# Patient Record
Sex: Male | Born: 1980 | Hispanic: No | Marital: Married | Smoking: Never smoker
Health system: Southern US, Community
[De-identification: ages and names within clinical notes are randomized; demographics above are authoritative.]

## PROBLEM LIST (undated history)

## (undated) DIAGNOSIS — E119 Type 2 diabetes mellitus without complications: Secondary | ICD-10-CM

## (undated) DIAGNOSIS — I10 Essential (primary) hypertension: Secondary | ICD-10-CM

---

## 2014-02-18 ENCOUNTER — Emergency Department (HOSPITAL_COMMUNITY)
Admission: EM | Admit: 2014-02-18 | Discharge: 2014-02-18 | Disposition: A | Discharge status: Home / Self Care | Payer: Self-pay | Attending: Emergency Medicine | Admitting: Emergency Medicine

## 2014-02-18 ENCOUNTER — Emergency Department (HOSPITAL_COMMUNITY): Payer: Self-pay

## 2014-02-18 DIAGNOSIS — S9030XA Contusion of unspecified foot, initial encounter: Secondary | ICD-10-CM | POA: Insufficient documentation

## 2014-02-18 DIAGNOSIS — Y929 Unspecified place or not applicable: Secondary | ICD-10-CM | POA: Insufficient documentation

## 2014-02-18 DIAGNOSIS — Z23 Encounter for immunization: Secondary | ICD-10-CM | POA: Insufficient documentation

## 2014-02-18 DIAGNOSIS — Y9389 Activity, other specified: Secondary | ICD-10-CM | POA: Insufficient documentation

## 2014-02-18 DIAGNOSIS — S9031XA Contusion of right foot, initial encounter: Secondary | ICD-10-CM

## 2014-02-18 DIAGNOSIS — W208XXA Other cause of strike by thrown, projected or falling object, initial encounter: Secondary | ICD-10-CM | POA: Insufficient documentation

## 2014-02-18 MED ORDER — HYDROCODONE-ACETAMINOPHEN 5-325 MG PO TABS
1.0000 | ORAL_TABLET | Freq: Once | ORAL | Status: AC
  Administered 2014-02-18: 1 via ORAL
  Filled 2014-02-18: qty 1

## 2014-02-18 MED ORDER — IBUPROFEN 800 MG PO TABS: 800.0000 mg | ORAL_TABLET | Freq: Three times a day (TID) | ORAL | Status: AC

## 2014-02-18 MED ORDER — HYDROCODONE-ACETAMINOPHEN 5-325 MG PO TABS: 1.0000 | ORAL_TABLET | ORAL | Status: AC | PRN

## 2014-02-18 MED ORDER — TETANUS-DIPHTH-ACELL PERTUSSIS 5-2.5-18.5 LF-MCG/0.5 IM SUSP
0.5000 mL | Freq: Once | INTRAMUSCULAR | Status: AC
Start: 2014-02-18 — End: 2014-02-18
  Administered 2014-02-18: 0.5 mL via INTRAMUSCULAR
  Filled 2014-02-18: qty 0.5

## 2014-02-18 NOTE — Discharge Instructions (Signed)
Contusion °A contusion is a deep bruise. Contusions are the result of an injury that caused bleeding under the skin. The contusion may turn blue, purple, or yellow. Minor injuries will give you a painless contusion, but more severe contusions may stay painful and swollen for a few weeks.  °CAUSES  °A contusion is usually caused by a blow, trauma, or direct force to an area of the body. °SYMPTOMS  °· Swelling and redness of the injured area. °· Bruising of the injured area. °· Tenderness and soreness of the injured area. °· Pain. °DIAGNOSIS  °The diagnosis can be made by taking a history and physical exam. An X-ray, CT scan, or MRI may be needed to determine if there were any associated injuries, such as fractures. °TREATMENT  °Specific treatment will depend on what area of the body was injured. In general, the best treatment for a contusion is resting, icing, elevating, and applying cold compresses to the injured area. Over-the-counter medicines may also be recommended for pain control. Ask your caregiver what the best treatment is for your contusion. °HOME CARE INSTRUCTIONS  °· Put ice on the injured area. °· Put ice in a plastic bag. °· Place a towel between your skin and the bag. °· Leave the ice on for 15-20 minutes, 03-04 times a day. °· Only take over-the-counter or prescription medicines for pain, discomfort, or fever as directed by your caregiver. Your caregiver may recommend avoiding anti-inflammatory medicines (aspirin, ibuprofen, and naproxen) for 48 hours because these medicines may increase bruising. °· Rest the injured area. °· If possible, elevate the injured area to reduce swelling. °SEEK IMMEDIATE MEDICAL CARE IF:  °· You have increased bruising or swelling. °· You have pain that is getting worse. °· Your swelling or pain is not relieved with medicines. °MAKE SURE YOU:  °· Understand these instructions. °· Will watch your condition. °· Will get help right away if you are not doing well or get  worse. °Document Released: 09/13/2005 Document Revised: 02/26/2012 Document Reviewed: 10/09/2011 °ExitCare® Patient Information ©2014 [employer]. ° °Cryotherapy °Cryotherapy means treatment with cold. Ice or gel packs can be used to reduce both pain and swelling. Ice is the most helpful within the first 24 to 48 hours after an injury or flareup from overusing a muscle or joint. Sprains, strains, spasms, burning pain, shooting pain, and aches can all be eased with ice. Ice can also be used when recovering from surgery. Ice is effective, has very few side effects, and is safe for most people to use. °PRECAUTIONS  °Ice is not a safe treatment option for people with: °· Raynaud's phenomenon. This is a condition affecting small blood vessels in the extremities. Exposure to cold may cause your problems to return. °· Cold hypersensitivity. There are many forms of cold hypersensitivity, including: °· Cold urticaria. Red, itchy hives appear on the skin when the tissues begin to warm after being iced. °· Cold erythema. This is a red, itchy rash caused by exposure to cold. °· Cold hemoglobinuria. Red blood cells break down when the tissues begin to warm after being iced. The hemoglobin that carry oxygen are passed into the urine because they cannot combine with blood proteins fast enough. °· Numbness or altered sensitivity in the area being iced. °If you have any of the following conditions, do not use ice until you have discussed cryotherapy with your caregiver: °· Heart conditions, such as arrhythmia, angina, or chronic heart disease. °· High blood pressure. °· Healing wounds or open skin   in the area being iced. °· Current infections. °· Rheumatoid arthritis. °· Poor circulation. °· Diabetes. °Ice slows the blood flow in the region it is applied. This is beneficial when trying to stop inflamed tissues from spreading irritating chemicals to surrounding tissues. However, if you expose your skin to cold temperatures for too  long or without the proper protection, you can damage your skin or nerves. Watch for signs of skin damage due to cold. °HOME CARE INSTRUCTIONS °Follow these tips to use ice and cold packs safely. °· Place a dry or damp towel between the ice and skin. A damp towel will cool the skin more quickly, so you may need to shorten the time that the ice is used. °· For a more rapid response, add gentle compression to the ice. °· Ice for no more than 10 to 20 minutes at a time. The bonier the area you are icing, the less time it will take to get the benefits of ice. °· Check your skin after 5 minutes to make sure there are no signs of a poor response to cold or skin damage. °· Rest 20 minutes or more in between uses. °· Once your skin is numb, you can end your treatment. You can test numbness by very lightly touching your skin. The touch should be so light that you do not see the skin dimple from the pressure of your fingertip. When using ice, most people will feel these normal sensations in this order: cold, burning, aching, and numbness. °· Do not use ice on someone who cannot communicate their responses to pain, such as small children or people with dementia. °HOW TO MAKE AN ICE PACK °Ice packs are the most common way to use ice therapy. Other methods include ice massage, ice baths, and cryo-sprays. Muscle creams that cause a cold, tingly feeling do not offer the same benefits that ice offers and should not be used as a substitute unless recommended by your caregiver. °To make an ice pack, do one of the following: °· Place crushed ice or a bag of frozen vegetables in a sealable plastic bag. Squeeze out the excess air. Place this bag inside another plastic bag. Slide the bag into a pillowcase or place a damp towel between your skin and the bag. °· Mix 3 parts water with 1 part rubbing alcohol. Freeze the mixture in a sealable plastic bag. When you remove the mixture from the freezer, it will be slushy. Squeeze out the excess  air. Place this bag inside another plastic bag. Slide the bag into a pillowcase or place a damp towel between your skin and the bag. °SEEK MEDICAL CARE IF: °· You develop white spots on your skin. This may give the skin a blotchy (mottled) appearance. °· Your skin turns blue or pale. °· Your skin becomes waxy or hard. °· Your swelling gets worse. °MAKE SURE YOU:  °· Understand these instructions. °· Will watch your condition. °· Will get help right away if you are not doing well or get worse. °Document Released: 07/31/2011 Document Revised: 02/26/2012 Document Reviewed: 07/31/2011 °ExitCare® Patient Information ©2014 [employer]. ° °

## 2014-02-18 NOTE — ED Notes (Signed)
Pt has a ride home.  

## 2014-02-18 NOTE — ED Notes (Signed)
Pt states a he was carrying a door and it hit him on the top of his R foot. Pt has swelling and bruising to top of R foot. Pt states he has been unable to bear wt on R leg.

## 2014-02-18 NOTE — ED Notes (Signed)
Ortho tech at bedside for post op shoe and crutches.

## 2014-02-18 NOTE — ED Provider Notes (Signed)
CSN: [id number]     Arrival date & time 02/18/14  1935 History  This chart was scribed for non-physician practitioner, Elpidio Anis, PA-C,working with Flint Melter, MD, by Karle Plumber, ED Scribe.  This patient was seen in room [facility] and the patient's care was started at 8:36 PM.  Chief Complaint  Patient presents with  . Foot Injury   The history is provided by the patient. No language interpreter was used.   HPI Comments:  Shawn Camacho is a 33 y.o. male who presents to the Emergency Department complaining of a right foot injury secondary to dropping a door on it approximately 2.5 hours ago. He reports associated swelling and a small abrasion to the dorsum of his foot. Pt states he is unaware of his last tetanus vaccination. He denies any chronic medical problems. He denies any other injuries.   No past medical history on file. No past surgical history on file. No family history on file. History  Substance Use Topics  . Smoking status: Not on file  . Smokeless tobacco: Not on file  . Alcohol Use: Not on file    Review of Systems  Musculoskeletal: Positive for arthralgias.  Skin: Positive for color change (bruise and abrasion to right dorsal foot).  All other systems reviewed and are negative.    Allergies  Review of patient's allergies indicates no known allergies.  Home Medications  No current outpatient prescriptions on file. Triage Vitals: BP 118/65  Pulse 77  Temp(Src) 98 F (36.7 C) (Oral)  Resp 18  SpO2 100% Physical Exam  Nursing note and vitals reviewed. Constitutional: He is oriented to person, place, and time. He appears well-developed and well-nourished.  HENT:  Head: Normocephalic and atraumatic.  Eyes: EOM are normal.  Neck: Normal range of motion.  Pulmonary/Chest: Effort normal. No respiratory distress.  Musculoskeletal: Normal range of motion. He exhibits edema and tenderness.  Right foot markedly swollen with superficial  abrasion to central dorsal forefoot. Full ROM of all digits. Ankle nontender. No obvious bony deformity.   Neurological: He is alert and oriented to person, place, and time.  Skin: Skin is warm and dry.  Psychiatric: He has a normal mood and affect. His behavior is normal.    ED Course  Procedures (including critical care time) DIAGNOSTIC STUDIES: Oxygen Saturation is 100% on RA, normal by my interpretation.   COORDINATION OF CARE: 8:37 PM- Will X-Ray right foot. Offered pain medication but pt declined. Will update tetanus vaccination. Pt verbalizes understanding and agrees to plan.  Medications  Tdap (BOOSTRIX) injection 0.5 mL (not administered)    Labs Review Labs Reviewed - No data to display Imaging Review Dg Foot Complete Right  02/18/2014   CLINICAL DATA:  Right foot pain and swelling.  EXAM: RIGHT FOOT COMPLETE - 3+ VIEW  COMPARISON:  None.  FINDINGS: Metatarsals are intact. There is no fracture or acute osseous abnormality. The hematomas prepped in the dorsal soft tissues of the forefoot overlying the metatarsals.  IMPRESSION: Dorsal forefoot soft cheek, without osseous injury.   Electronically Signed   By: Andreas Newport M.D.   On: 02/18/2014 21:19     EKG Interpretation None      MDM   Final diagnoses:  None    1. Foot contusion, right  Negative x-ray for fracture. Foot significantly swollen and discolored but with full ROM all digits. Doubt ligamentous to tendon compromise. Ice, elevate, pain control.   I personally performed the services described in this documentation,  which was scribed in my presence. The recorded information has been reviewed and is accurate.    Arnoldo HookerShari A Darivs Lunden, PA-C 02/18/14 2147

## 2014-02-18 NOTE — ED Notes (Signed)
Patient transported to X-ray 

## 2014-02-19 NOTE — ED Provider Notes (Signed)
Medical screening examination/treatment/procedure(s) were performed by non-physician practitioner and as supervising physician I was immediately available for consultation/collaboration.  Hailie Searight L Cherye Gaertner, MD 02/19/14 0000 

## 2015-04-09 IMAGING — CR DG FOOT COMPLETE 3+V*R*
3 series · 3 of 3 positions shown · non-contrast
Comparison: None.

CLINICAL DATA: Right foot pain and swelling.

EXAM:
RIGHT FOOT COMPLETE - 3+ VIEW

[x foot ap right]
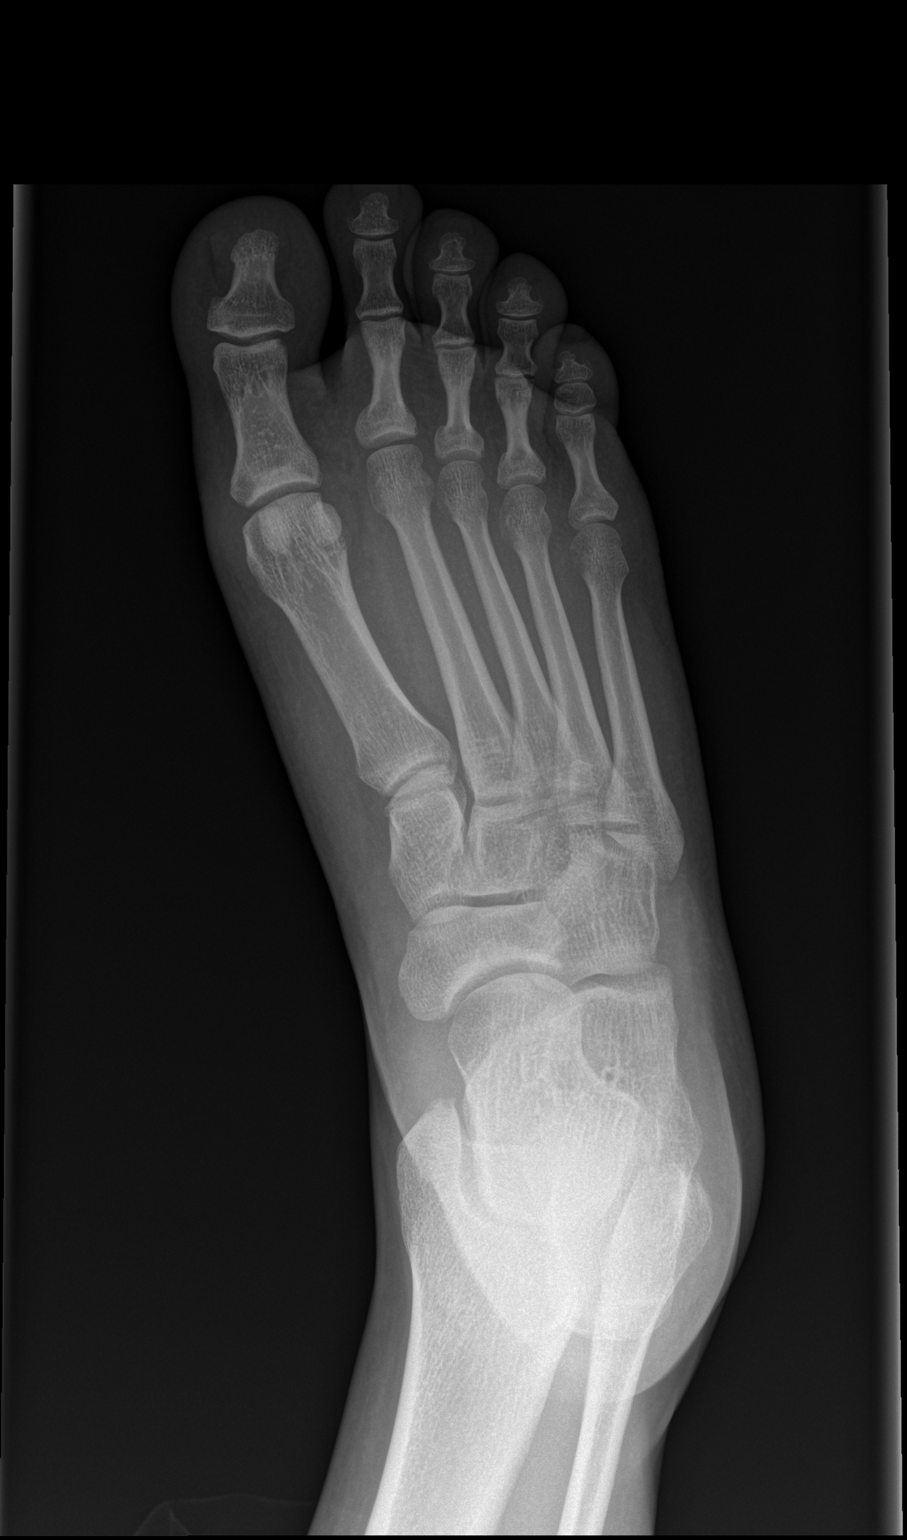

[x foot obl right]
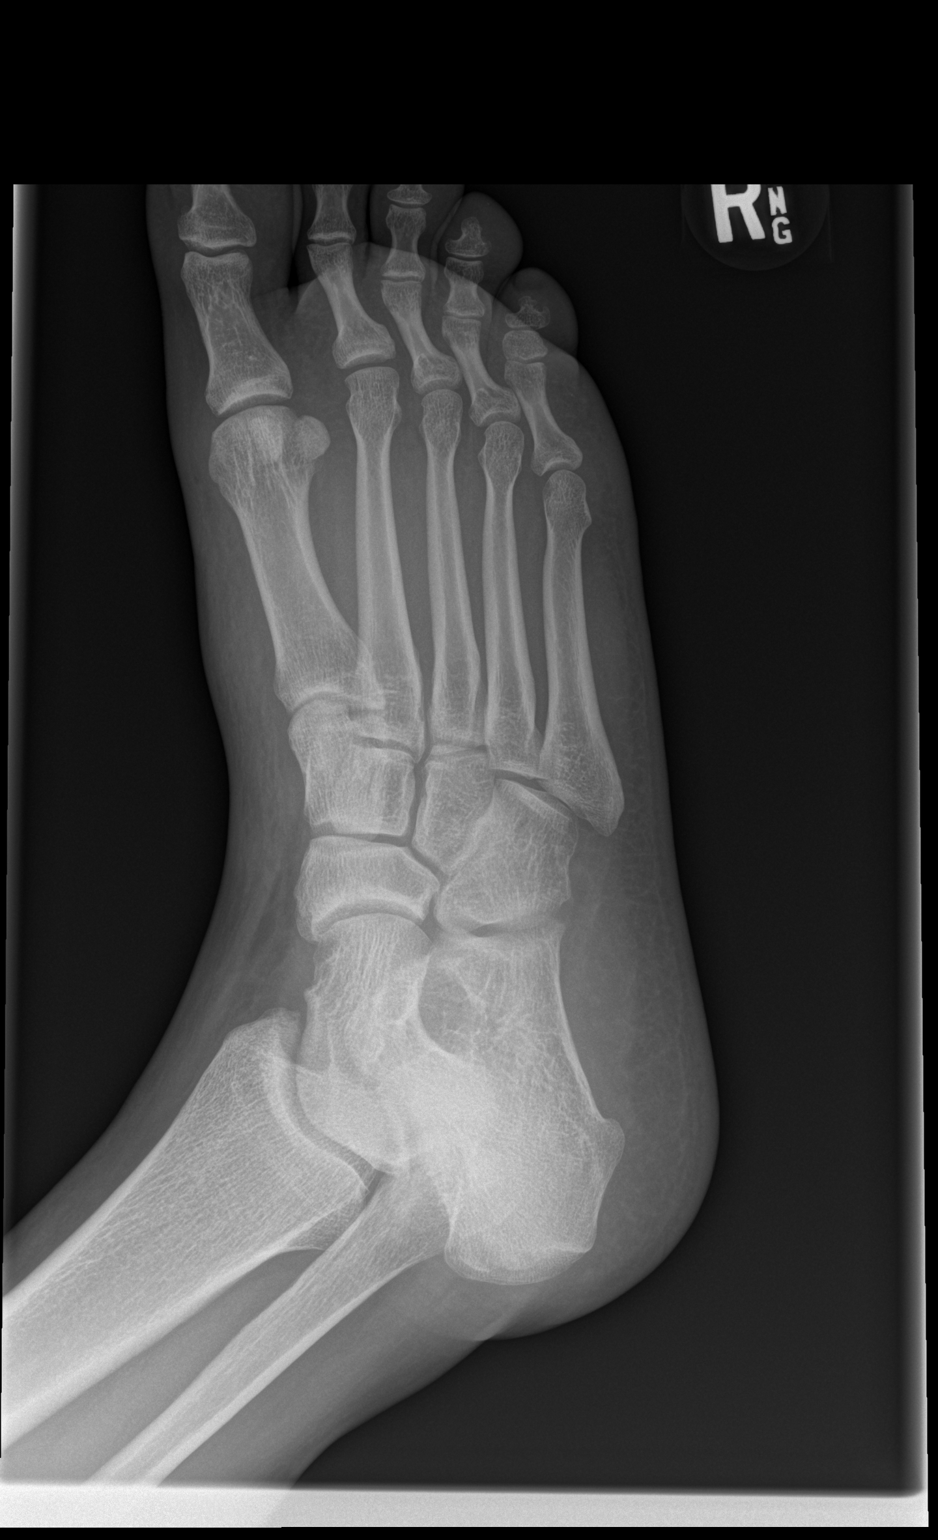

[x foot lat right]
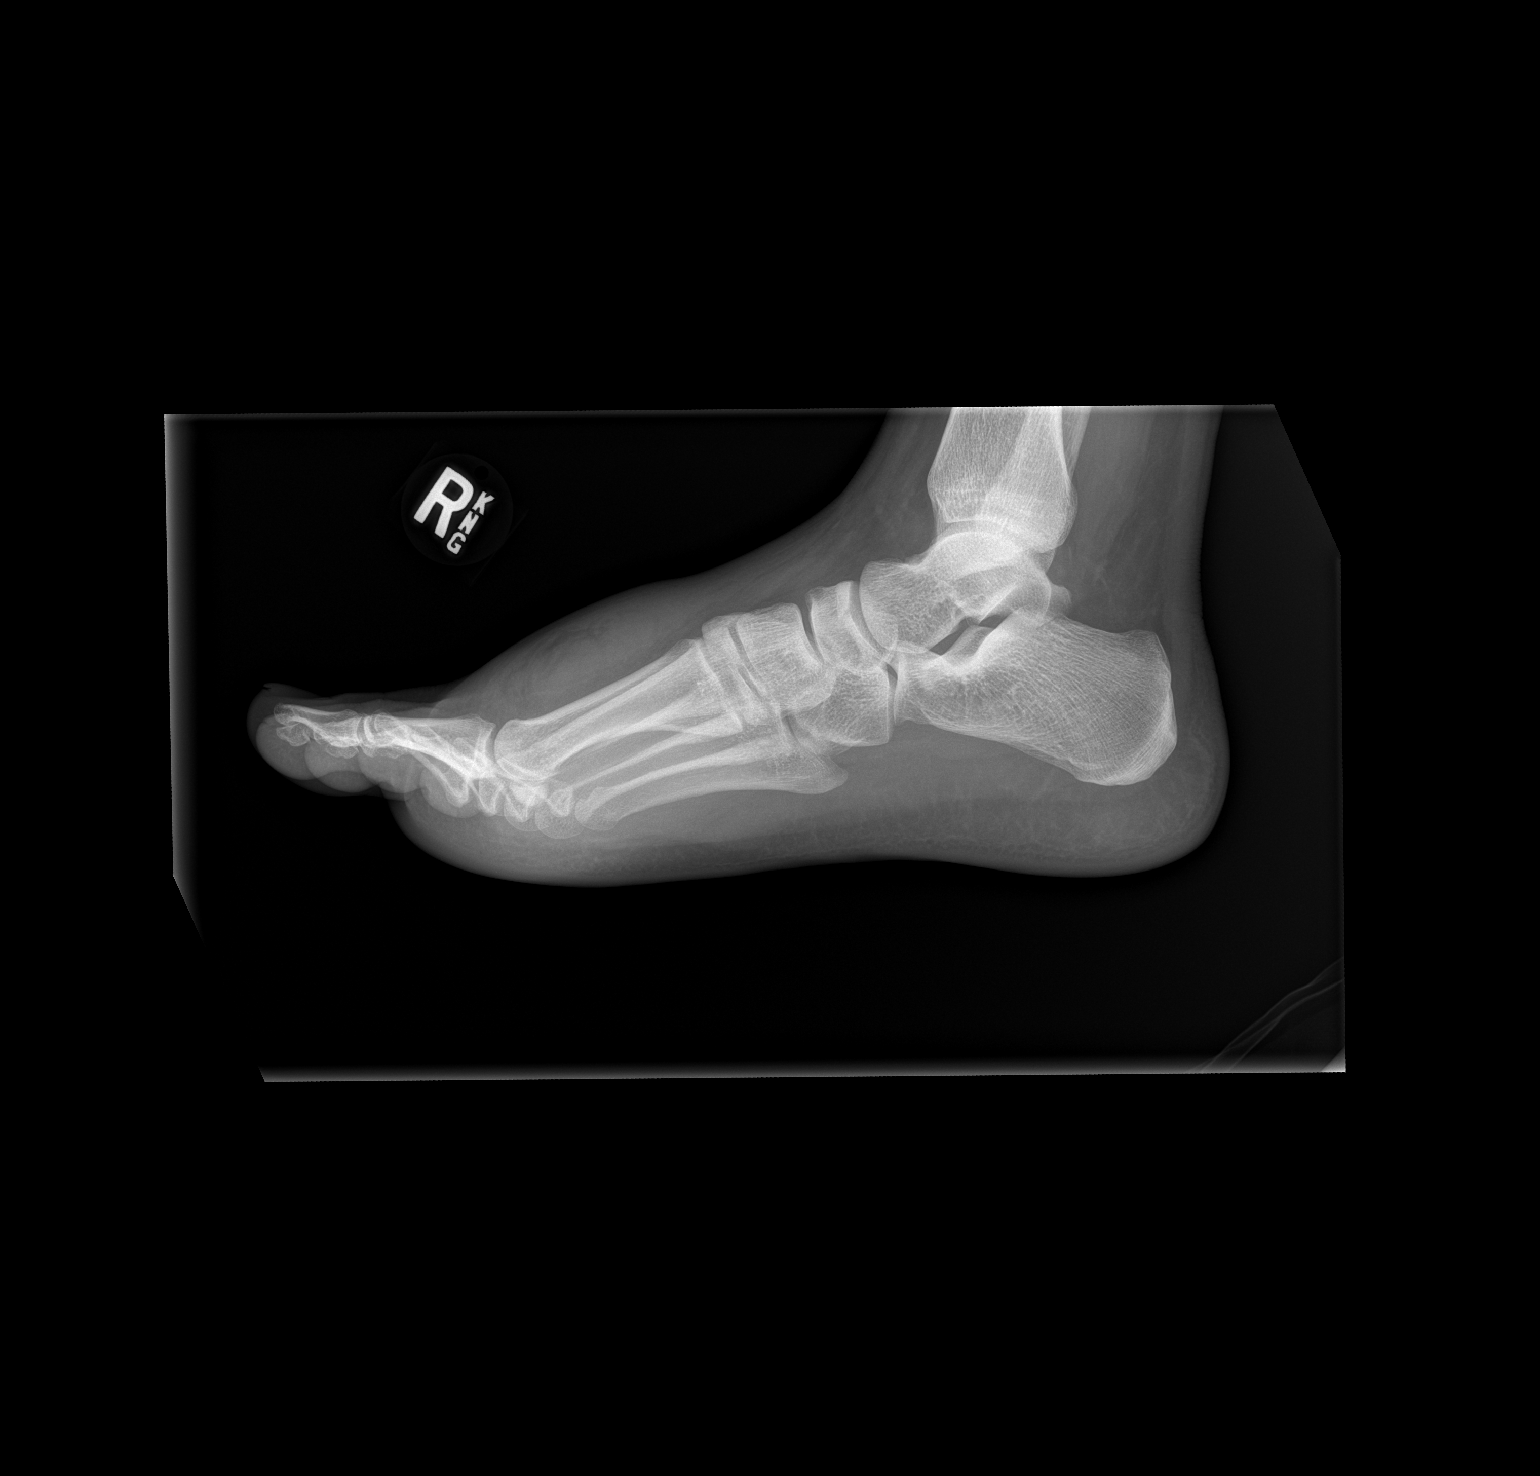

[3 of 3 positions shown; findings below may reference images not displayed]

FINDINGS: Metatarsals are intact. There is no fracture or acute osseous
abnormality. The hematomas prepped in the dorsal soft tissues of the
forefoot overlying the metatarsals.
IMPRESSION: Dorsal forefoot soft cheek, without osseous injury.

## 2019-10-06 ENCOUNTER — Other Ambulatory Visit: Payer: Self-pay

## 2019-10-06 ENCOUNTER — Emergency Department (HOSPITAL_BASED_OUTPATIENT_CLINIC_OR_DEPARTMENT_OTHER)
Admission: EM | Admit: 2019-10-06 | Discharge: 2019-10-06 | Disposition: A | Discharge status: Home / Self Care | Payer: BLUE CROSS/BLUE SHIELD | Attending: Emergency Medicine | Admitting: Emergency Medicine

## 2019-10-06 ENCOUNTER — Emergency Department (HOSPITAL_BASED_OUTPATIENT_CLINIC_OR_DEPARTMENT_OTHER): Payer: BLUE CROSS/BLUE SHIELD

## 2019-10-06 ENCOUNTER — Encounter (HOSPITAL_BASED_OUTPATIENT_CLINIC_OR_DEPARTMENT_OTHER): Payer: Self-pay | Admitting: *Deleted

## 2019-10-06 DIAGNOSIS — E119 Type 2 diabetes mellitus without complications: Secondary | ICD-10-CM | POA: Diagnosis not present

## 2019-10-06 DIAGNOSIS — I10 Essential (primary) hypertension: Secondary | ICD-10-CM | POA: Diagnosis not present

## 2019-10-06 DIAGNOSIS — Z7984 Long term (current) use of oral hypoglycemic drugs: Secondary | ICD-10-CM | POA: Diagnosis not present

## 2019-10-06 DIAGNOSIS — M25572 Pain in left ankle and joints of left foot: Secondary | ICD-10-CM | POA: Diagnosis not present

## 2019-10-06 DIAGNOSIS — X501XXS Overexertion from prolonged static or awkward postures, sequela: Secondary | ICD-10-CM | POA: Diagnosis not present

## 2019-10-06 HISTORY — DX: Essential (primary) hypertension: I10

## 2019-10-06 HISTORY — DX: Type 2 diabetes mellitus without complications: E11.9

## 2019-10-06 NOTE — ED Provider Notes (Signed)
[facility] Provider Note   CSN: [id number] Arrival date & time: 10/06/19  1408     History   Chief Complaint Chief Complaint  Patient presents with  . Ankle Injury    HPI Shawn Camacho is a 38 y.o. male with a past medical history of diabetes, hypertension, who presents today for evaluation of pain in his left ankle.  He reports that 2 days ago he was walking and stepped down rolling his left ankle.  He reports that since then it has been swollen.  It primarily hurts when he puts weight on it.  He denies any pain in his foot or left knee.  No other injuries from this.  He states that his wife has been using turmeric paste on the area causing his skin to turn yellow in that area however he denies any other skin yellowing.  Patient was offered a professional [place] interpreter, however declined stating he does not need an interpreter.     HPI  Past Medical History:  Diagnosis Date  . Diabetes mellitus without complication ([place])   . Hypertension     There are no active problems to display for this patient.   History reviewed. No pertinent surgical history.      Home Medications    Prior to Admission medications   Medication Sig Start Date End Date Taking? Authorizing Provider  GLIMEPIRIDE PO Take by mouth.   Yes [provider]  LISINOPRIL PO Take by mouth.   Yes [provider]  metFORMIN (GLUCOPHAGE) 1000 MG tablet Take 1,000 mg by mouth 2 (two) times daily with a meal.   Yes [provider]  HYDROcodone-acetaminophen (NORCO/VICODIN) 5-325 MG per tablet Take 1-2 tablets by mouth every 4 (four) hours as needed. 02/18/14   Charlann Lange, PA-C  ibuprofen (ADVIL,MOTRIN) 800 MG tablet Take 1 tablet (800 mg total) by mouth 3 (three) times daily. 02/18/14   Charlann Lange, PA-C    Family History No family history on file.  Social History Social History   Tobacco Use  . Smoking status: Never Smoker   . Smokeless tobacco: Never Used  Substance Use Topics  . Alcohol use: Never    Frequency: Never  . Drug use: Never     Allergies   Patient has no known allergies.   Review of Systems Review of Systems  Constitutional: Negative for chills and fever.  Musculoskeletal:       Left ankle pain, swelling  Neurological: Negative for weakness and headaches.  All other systems reviewed and are negative.    Physical Exam Updated Vital Signs BP 104/73   Pulse 77   Temp 98.9 F (37.2 C) (Oral)   Resp 14   Ht 6\' 1"  (1.854 m)   Wt 83.5 kg   SpO2 100%   BMI 24.28 kg/m   Physical Exam Vitals signs and nursing note reviewed.  Constitutional:      General: He is not in acute distress.    Appearance: He is not ill-appearing.  HENT:     Head: Normocephalic.  Eyes:     Conjunctiva/sclera: Conjunctivae normal.     Comments: No visualized scleral icterus.  Cardiovascular:     Rate and Rhythm: Normal rate.  Pulmonary:     Effort: Pulmonary effort is normal. No respiratory distress.  Musculoskeletal:     Comments: Left lower extremity: He is able to move the toes on his left foot.  He has slightly limited ankle plantarflexion and dorsiflexion  secondary to pain and swelling.  There is no abnormal erythema, or significant pain with motion during exam.  There is mild edema over the bilateral malleoli, lateral worse than medial.  There is no tenderness to palpation over the left foot, left proximal lower leg or knee.  Skin:    Comments: There is slight yellowing over the swollen areas over the left bilateral malleoli consistent with reported to [place] use.  No other visualized skin yellowing.  Neurological:     Mental Status: He is alert.     Comments: Sensation intact to left foot to light touch.      ED Treatments / Results  Labs (all labs ordered are listed, but only abnormal results are displayed) Labs Reviewed - No data to display  EKG None  Radiology Dg Ankle Complete  Left  Result Date: 10/06/2019 CLINICAL DATA:  Twisted left ankle. EXAM: LEFT ANKLE COMPLETE - 3+ VIEW COMPARISON:  No prior. FINDINGS: Diffuse soft tissue swelling. No evidence of fracture or dislocation. IMPRESSION: No acute bony abnormality identified. Electronically Signed   By: Maisie Fus  Register   On: 10/06/2019 14:53    Procedures Procedures (including critical care time)  Medications Ordered in ED Medications - No data to display   Initial Impression / Assessment and Plan / ED Course  I have reviewed the triage vital signs and the nursing notes.  Pertinent labs & imaging results that were available during my care of the patient were reviewed by me and considered in my medical decision making (see chart for details).       Presents today for evaluation of left ankle pain.  2 days ago he misstepped spraining his left ankle.  X-rays were obtained without evidence of fracture or other acute abnormality. Clinically suspect sprain.  He does have mild edema over the left bilateral malleoli lateral greater than medial.  He does have some slight skin yellowing consistent with a reported tumeric  use.  He states that he has crutches at home and does not need any.  He is given an ASO.  He is given instructions on rice, OTC ibuprofen and Tylenol.  He is given orthopedics follow-up in case his symptoms do not significantly improve in 1 week.  He is neurovascularly intact on exam and ankle is grossly stable.  Return precautions were discussed with patient who states their understanding.  At the time of discharge patient denied any unaddressed complaints or concerns.  Patient is agreeable for discharge home.    Final Clinical Impressions(s) / ED Diagnoses   Final diagnoses:  Acute left ankle pain    ED Discharge Orders    None       Cristina Gong, Cordelia Poche 10/06/19 1552    Tegeler, Canary Brim, MD 10/07/19 [phone]

## 2019-10-06 NOTE — Discharge Instructions (Signed)
You may take your splint off to shower, and at night however otherwise I would recommend that you wear it if you are up and moving around. You may also take it off to ice.  You only need to use it as long as you feel that you need to.  If you feel like you do not need the splint then you can stop using it.  Please take Ibuprofen (Advil, motrin) and Tylenol (acetaminophen) to relieve your pain.  You may take up to 600 MG (3 pills) of normal strength ibuprofen every 8 hours as needed.  In between doses of ibuprofen you make take tylenol, up to 1,000 mg (two extra strength pills).  Do not take more than 3,000 mg tylenol in a 24 hour period.  Please check all medication labels as many medications such as pain and cold medications may contain tylenol.  Do not drink alcohol while taking these medications.  Do not take other NSAID'S while taking ibuprofen (such as aleve or naproxen).  Please take ibuprofen with food to decrease stomach upset.

## 2019-10-06 NOTE — ED Triage Notes (Signed)
He twisted his left ankle 2 days ago. Swelling and pain. His wife has been using home remedies that caused his skin to be yellow.

## 2020-11-24 IMAGING — CR DG ANKLE COMPLETE 3+V*L*
3 series · 3 of 3 positions shown · non-contrast
Comparison: No prior.

CLINICAL DATA: Twisted left ankle.

EXAM:
LEFT ANKLE COMPLETE - 3+ VIEW

[t ankle joint ap left]
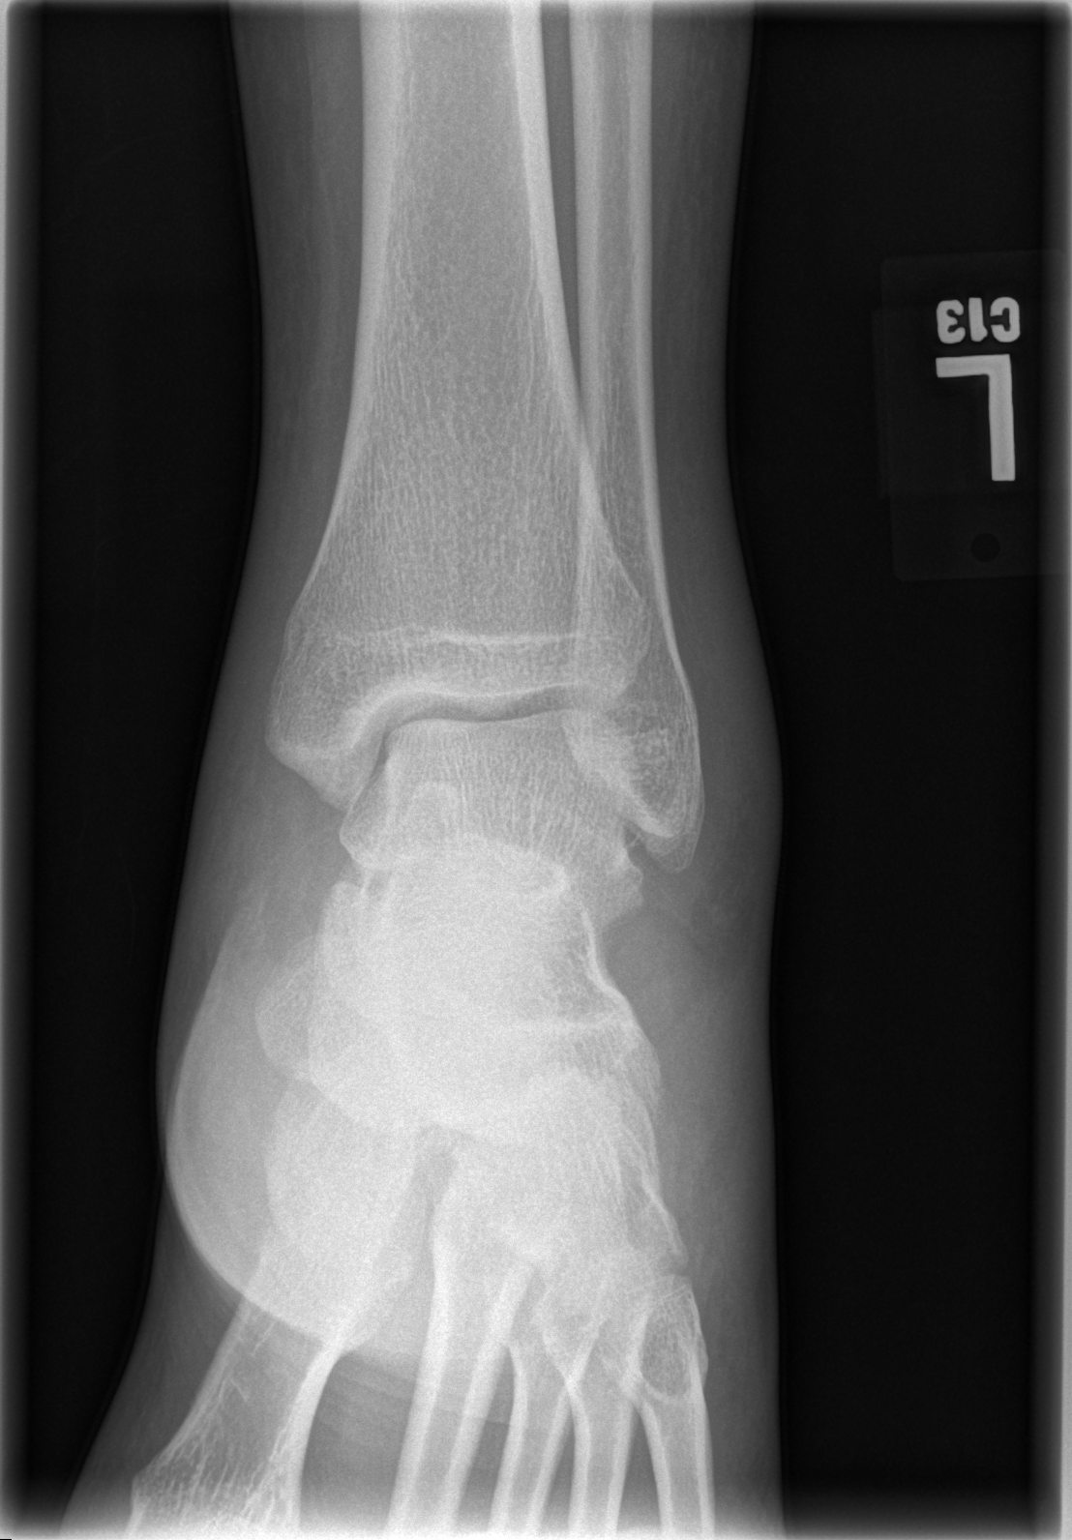

[t ankle joint oblique left]
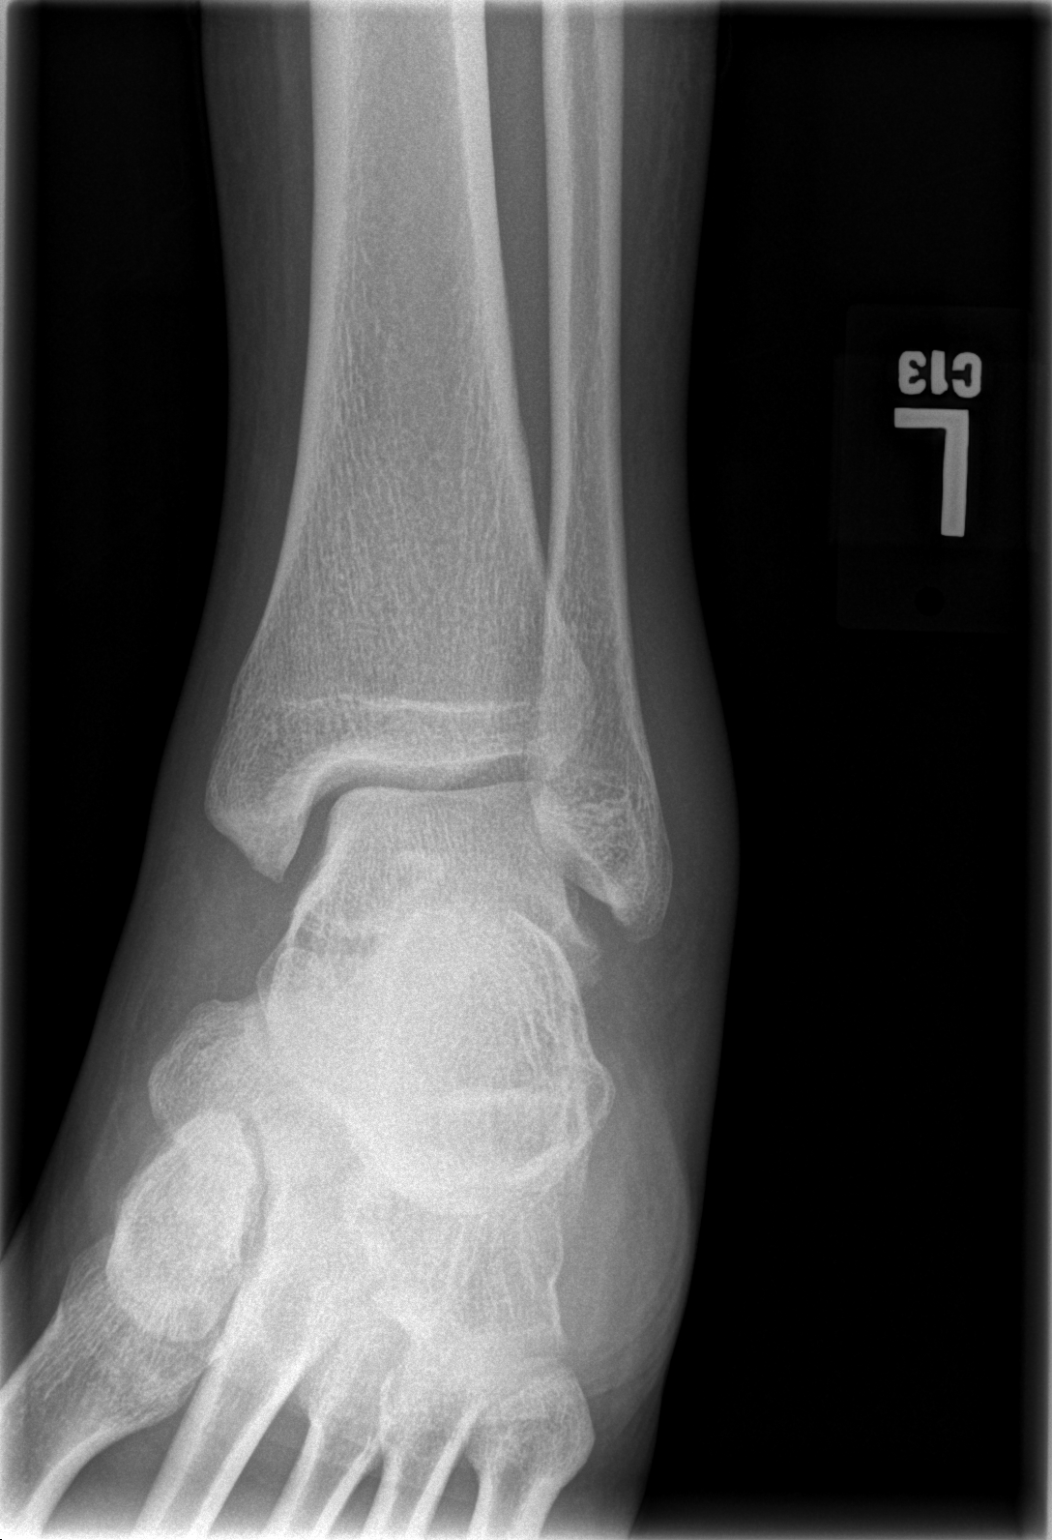

[t ankle joint lat left]
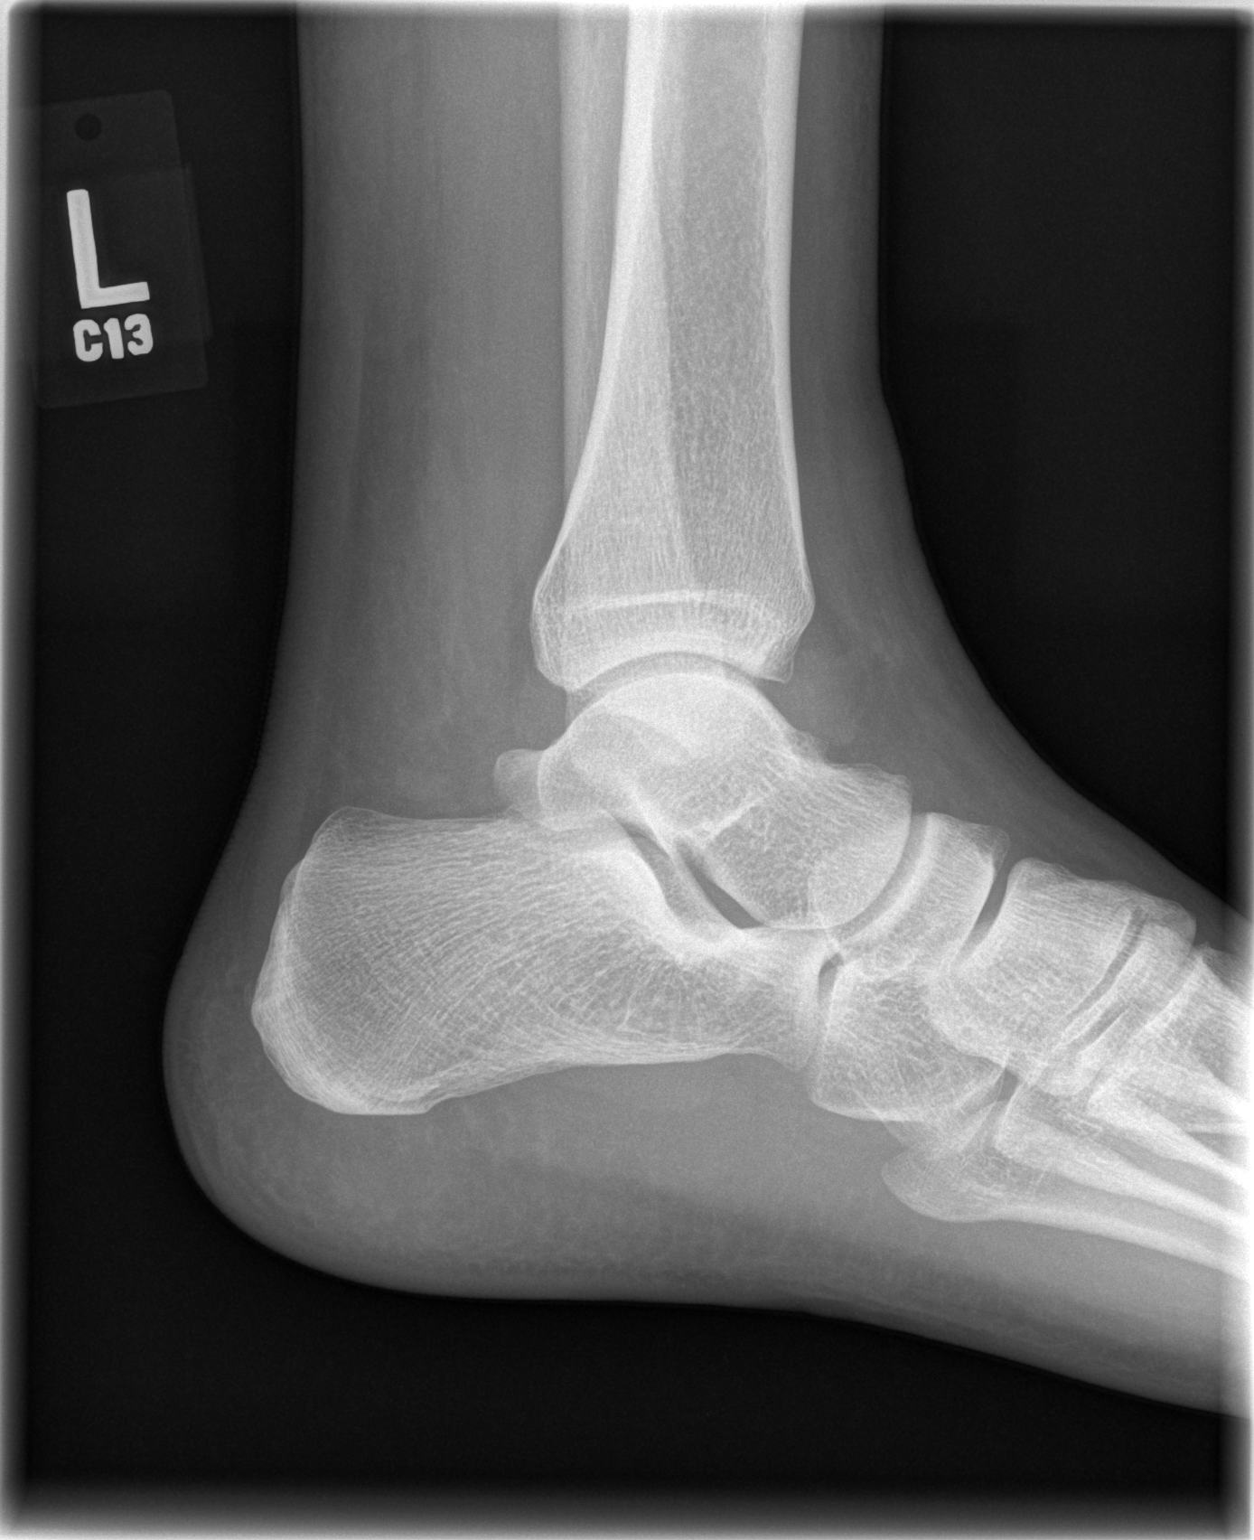

[3 of 3 positions shown; findings below may reference images not displayed]

FINDINGS: Diffuse soft tissue swelling. No evidence of fracture or
dislocation.
IMPRESSION: No acute bony abnormality identified.

## 2021-03-25 ENCOUNTER — Other Ambulatory Visit: Payer: Self-pay | Admitting: Internal Medicine

## 2021-03-25 DIAGNOSIS — M2420 Disorder of ligament, unspecified site: Secondary | ICD-10-CM

## 2021-04-04 ENCOUNTER — Ambulatory Visit
Admission: RE | Admit: 2021-04-04 | Discharge: 2021-04-04 | Disposition: A | Discharge status: Home / Self Care | Payer: BLUE CROSS/BLUE SHIELD | Source: Ambulatory Visit | Attending: Internal Medicine | Admitting: Internal Medicine

## 2021-04-04 DIAGNOSIS — M2420 Disorder of ligament, unspecified site: Secondary | ICD-10-CM
